# Patient Record
Sex: Male | Born: 1965 | Race: White | Hispanic: No | Marital: Married | Smoking: Never smoker
Health system: Southern US, Community
[De-identification: ages and names within clinical notes are randomized; demographics above are authoritative.]

## PROBLEM LIST (undated history)

## (undated) DIAGNOSIS — E039 Hypothyroidism, unspecified: Secondary | ICD-10-CM

## (undated) DIAGNOSIS — G43909 Migraine, unspecified, not intractable, without status migrainosus: Secondary | ICD-10-CM

## (undated) HISTORY — PX: BRAIN SURGERY: SHX531

## (undated) HISTORY — PX: APPENDECTOMY: SHX54

---

## 2004-11-05 ENCOUNTER — Ambulatory Visit: Payer: Self-pay | Admitting: Urology

## 2005-07-18 ENCOUNTER — Other Ambulatory Visit: Payer: Self-pay

## 2005-07-18 ENCOUNTER — Emergency Department: Payer: Self-pay | Admitting: Emergency Medicine

## 2005-08-26 ENCOUNTER — Ambulatory Visit: Payer: Self-pay | Admitting: Internal Medicine

## 2005-09-08 ENCOUNTER — Ambulatory Visit: Payer: Self-pay | Admitting: Internal Medicine

## 2005-10-28 ENCOUNTER — Ambulatory Visit: Payer: Self-pay | Admitting: Internal Medicine

## 2005-12-31 ENCOUNTER — Ambulatory Visit: Payer: Self-pay | Admitting: Internal Medicine

## 2006-01-15 ENCOUNTER — Ambulatory Visit: Payer: Self-pay | Admitting: Internal Medicine

## 2006-12-24 ENCOUNTER — Ambulatory Visit: Payer: Self-pay | Admitting: Internal Medicine

## 2008-06-21 ENCOUNTER — Ambulatory Visit: Payer: Self-pay | Admitting: Otolaryngology

## 2008-07-04 ENCOUNTER — Ambulatory Visit: Payer: Self-pay | Admitting: Otolaryngology

## 2008-08-01 ENCOUNTER — Ambulatory Visit: Payer: Self-pay | Admitting: Unknown Physician Specialty

## 2008-08-03 ENCOUNTER — Ambulatory Visit: Payer: Self-pay | Admitting: Unknown Physician Specialty

## 2011-09-18 ENCOUNTER — Ambulatory Visit: Payer: Self-pay

## 2013-07-21 ENCOUNTER — Ambulatory Visit: Payer: Self-pay | Admitting: Emergency Medicine

## 2014-08-10 ENCOUNTER — Ambulatory Visit: Payer: Self-pay

## 2014-12-27 ENCOUNTER — Ambulatory Visit
Admit: 2014-12-27 | Disposition: A | Discharge status: Home / Self Care | Payer: Self-pay | Attending: Family Medicine | Admitting: Family Medicine

## 2015-01-28 ENCOUNTER — Ambulatory Visit
Admission: EM | Admit: 2015-01-28 | Discharge: 2015-01-28 | Disposition: A | Discharge status: Home / Self Care | Payer: BLUE CROSS/BLUE SHIELD | Attending: Family Medicine | Admitting: Family Medicine

## 2015-01-28 ENCOUNTER — Encounter: Payer: Self-pay | Admitting: Emergency Medicine

## 2015-01-28 DIAGNOSIS — J302 Other seasonal allergic rhinitis: Secondary | ICD-10-CM | POA: Diagnosis not present

## 2015-01-28 DIAGNOSIS — H6993 Unspecified Eustachian tube disorder, bilateral: Secondary | ICD-10-CM

## 2015-01-28 DIAGNOSIS — H6983 Other specified disorders of Eustachian tube, bilateral: Secondary | ICD-10-CM

## 2015-01-28 HISTORY — DX: Migraine, unspecified, not intractable, without status migrainosus: G43.909

## 2015-01-28 HISTORY — DX: Hypothyroidism, unspecified: E03.9

## 2015-01-28 MED ORDER — BENZONATATE 100 MG PO CAPS: 100.0000 mg | ORAL_CAPSULE | Freq: Three times a day (TID) | ORAL | Status: DC | PRN

## 2015-01-28 MED ORDER — FLUTICASONE PROPIONATE 50 MCG/ACT NA SUSP: 2.0000 | Freq: Every day | NASAL | Status: DC

## 2015-01-28 NOTE — Discharge Instructions (Signed)
Take Zyrtec /ceterizine 1 tablet twice daily for 3-5 days then reduce to one tablet daily---RX Flonase/fluticasone nasal spray 1 spray per nostril twice daily for 3-5 days then reduce to one a day each nostril. Add Guafenisin /Robitussin DM 1 tsp every 4 hours as needed to liquefy mucous and stop cough .RX Benzonatate yellow capsule for cough 1 three times a day as needed. Push fluids- void frequently - steamy shower.Barotitis Media Barotitis media is inflammation of your middle ear. This occurs when the auditory tube (eustachian tube) leading from the back of your nose (nasopharynx) to your eardrum is blocked. This blockage may result from a cold, environmental allergies, or an upper respiratory infection. Unresolved barotitis media may lead to damage or hearing loss (barotrauma), which may become permanent. HOME CARE INSTRUCTIONS   Use medicines as recommended by your health care provider. Over-the-counter medicines will help unblock the canal and can help during times of air travel.  Do not put anything into your ears to clean or unplug them. Eardrops will not be helpful.  Do not swim, dive, or fly until your health care provider says it is all right to do so. If these activities are necessary, chewing gum with frequent, forceful swallowing may help. It is also helpful to hold your nose and gently blow to pop your ears for equalizing pressure changes. This forces air into the eustachian tube.  Only take over-the-counter or prescription medicines for pain, discomfort, or fever as directed by your health care provider.  A decongestant may be helpful in decongesting the middle ear and make pressure equalization easier. SEEK MEDICAL CARE IF:  You experience a serious form of dizziness in which you feel as if the room is spinning and you feel nauseated (vertigo).  Your symptoms only involve one ear. SEEK IMMEDIATE MEDICAL CARE IF:   You develop a severe headache, dizziness, or severe ear  pain.  You have bloody or pus-like drainage from your ears.  You develop a fever.  Your problems do not improve or become worse. MAKE SURE YOU:   Understand these instructions.  Will watch your condition.  Will get help right away if you are not doing well or get worse. Document Released: 08/21/2000 Document Revised: 06/14/2013 Document Reviewed: 03/21/2013 [facility] Patient Information 2015 [place], [place]. This information is not intended to replace advice given to you by your health care provider. Make sure you discuss any questions you have with your health care provider.  Allergic Rhinitis Allergic rhinitis is when the mucous membranes in the nose respond to allergens. Allergens are particles in the air that cause your body to have an allergic reaction. This causes you to release allergic antibodies. Through a chain of events, these eventually cause you to release histamine into the blood stream. Although meant to protect the body, it is this release of histamine that causes your discomfort, such as frequent sneezing, congestion, and an itchy, runny nose.  CAUSES  Seasonal allergic rhinitis (hay fever) is caused by pollen allergens that may come from grasses, trees, and weeds. Year-round allergic rhinitis (perennial allergic rhinitis) is caused by allergens such as house dust mites, pet dander, and mold spores.  SYMPTOMS   Nasal stuffiness (congestion).  Itchy, runny nose with sneezing and tearing of the eyes. DIAGNOSIS  Your health care provider can help you determine the allergen or allergens that trigger your symptoms. If you and your health care provider are unable to determine the allergen, skin or blood testing may be used. TREATMENT  Allergic rhinitis does not have a cure, but it can be controlled by:  Medicines and allergy shots (immunotherapy).  Avoiding the allergen. Hay fever may often be treated with antihistamines in pill or nasal spray forms. Antihistamines block  the effects of histamine. There are over-the-counter medicines that may help with nasal congestion and swelling around the eyes. Check with your health care provider before taking or giving this medicine.  If avoiding the allergen or the medicine prescribed do not work, there are many new medicines your health care provider can prescribe. Stronger medicine may be used if initial measures are ineffective. Desensitizing injections can be used if medicine and avoidance does not work. Desensitization is when a patient is given ongoing shots until the body becomes less sensitive to the allergen. Make sure you follow up with your health care provider if problems continue. HOME CARE INSTRUCTIONS It is not possible to completely avoid allergens, but you can reduce your symptoms by taking steps to limit your exposure to them. It helps to know exactly what you are allergic to so that you can avoid your specific triggers. SEEK MEDICAL CARE IF:   You have a fever.  You develop a cough that does not stop easily (persistent).  You have shortness of breath.  You start wheezing.  Symptoms interfere with normal daily activities. Document Released: 05/19/2001 Document Revised: 08/29/2013 Document Reviewed: 05/01/2013 [facility] Patient Information 2015 [place]. This information is not intended to replace advice given to you by your health care provider. Make sure you discuss any questions you have with your health care provider.

## 2015-01-28 NOTE — ED Provider Notes (Signed)
CSN: [id number]     Arrival date & time 01/28/15  6962 History   First MD Initiated Contact with Patient 01/28/15 [phone]     Chief Complaint  Patient presents with  . Sore Throat  . Sinusitis  . Otalgia    left ear   (Consider location/radiation/quality/duration/timing/severity/associated sxs/prior Treatment) HPI  49 yo M perturbed about a sore throat, stuffy ears and itchy eyes. Called PCP for antibiotic last week and was given Zpak -not examined. He is not happy that despite "those 5 little pills" being gone that he still has his symptoms. Afebrile.  Ears feel full-like going up the mountain, occassional popping noise  Past Medical History  Diagnosis Date  . Migraine   . Hypothyroidism    Past Surgical History  Procedure Laterality Date  . Appendectomy    . Brain surgery     History reviewed. No pertinent family history. History  Substance Use Topics  . Smoking status: Never Smoker   . Smokeless tobacco: Never Used  . Alcohol Use: No    Review of Systems  Constitutional -afebrile Eyes-denies visual changes, eyes are itchy ENT- normal voice,scratchy sore throat CV-denies chest pain Resp-denies SOB GI- negative for nausea,vomiting, diarrhea GU- negative for dysuria MSK- negative for back pain, ambulatory Skin- denies acute changes Neuro- negative headache,focal weakness or numbness    Allergies  Review of patient's allergies indicates no known allergies.  Home Medications   Prior to Admission medications   Medication Sig Start Date End Date Taking? Authorizing Provider  levothyroxine (SYNTHROID, LEVOTHROID) 175 MCG tablet Take 175 mcg by mouth daily before breakfast.   Yes Historical Provider, MD  zolmitriptan (ZOMIG) 5 MG tablet Take 5 mg by mouth as needed for migraine.   Yes Historical Provider, MD  benzonatate (TESSALON) 100 MG capsule Take 1 capsule (100 mg total) by mouth 3 (three) times daily as needed for cough. 01/28/15   Rae Halsted, PA-C  fluticasone  [facility]) 50 MCG/ACT nasal spray Place 2 sprays into both nostrils daily. 01/28/15   Rae Halsted, PA-C   BP 119/62 mmHg  Pulse 81  Temp(Src) 96.4 F (35.8 C) (Tympanic)  Resp 16  Ht  (1.803 m)  Wt 221 lb (100.245 kg)  BMI 30.84 kg/m2  SpO2 96% Physical Exam  Constitutional -alert and oriented,well appearing and in mild distress Head-atraumatic Eyes- conjunctiva normal, EOMI ,conjugate gaze- eyes are red rimmed and skin  Irritated Ears - TM clear bilaterally , mild retraction, no erythema Nose- Has mild congestion and rhinorrhea Mouth/throat- mucous membranes moist ,oropharynx non-erythematous Neck- supple without glandular enlargement CV- regular rate, grossly normal heart sounds, good peripheral circulation Resp-no distress, normal respiratory effort,clear to auscultation bilaterally GI- soft,non-tender,no distention GU- unremarkable/ not examined MSK- no lower extremity tenderness nor edema,no joint effusion, ambulatory Neuro- normal speech and language, no gross focal neurological deficit appreciated, no gait instability Skin-warm,dry ,intact; no rash noted Psych-mood and affect grossly normal; speech and behavior grossly normal  ED Course  Procedures (including critical care time) Labs Review Labs Reviewed - No data to display  Imaging Review No results found.   MDM   1. Eustachian tube dysfunction, bilateral   2. Seasonal allergies     Discussed viral syndrome and lack of response to antibiotic Rx.Also reviewed that atb has a prolonged half life and is in system if there is exposure to susceptible bacteria in the interim. Discussed seasonal allergies and eustachian tube dysfunction which seems to be most compatible with his presentation  and his story.  Plan: 1.  Diagnosis reviewed with patient-seasonal allergies suspected 2. Rx Benzonatate, fluticasone ; risks, benefits, potential side effects reviewed with patient 3. Recommend supportive treatment with OTC  ceterizine/guafenesin/tylenol/ibuprofen/fluids 4. F/u prn if symptoms worsen or don't improve; viral syndrome can be similar  Patient expresses understanding and interacts with questions and seems to want to understand.       Rae HalstedLaurie W Zakaria Sedor, PA-C 01/28/15 1600

## 2015-01-28 NOTE — ED Notes (Signed)
Patient c/o sore throat, sinus pain and pressure, HAs, and left ear pain for a week.  Patient states that he started a Z-Pack on Tuesday and has finished it. Patient denies recent fevers.

## 2019-06-09 ENCOUNTER — Other Ambulatory Visit: Payer: Self-pay | Admitting: *Deleted

## 2019-06-09 DIAGNOSIS — Z20822 Contact with and (suspected) exposure to covid-19: Secondary | ICD-10-CM

## 2019-06-10 LAB — NOVEL CORONAVIRUS, NAA: SARS-CoV-2, NAA: NOT DETECTED

## 2021-06-20 ENCOUNTER — Other Ambulatory Visit: Payer: Self-pay

## 2021-06-20 ENCOUNTER — Emergency Department
Admission: EM | Admit: 2021-06-20 | Discharge: 2021-06-21 | Disposition: A | Discharge status: Home / Self Care | Payer: BLUE CROSS/BLUE SHIELD | Attending: Emergency Medicine | Admitting: Emergency Medicine

## 2021-06-20 ENCOUNTER — Emergency Department: Payer: BLUE CROSS/BLUE SHIELD

## 2021-06-20 ENCOUNTER — Encounter: Payer: Self-pay | Admitting: Emergency Medicine

## 2021-06-20 DIAGNOSIS — S0990XA Unspecified injury of head, initial encounter: Secondary | ICD-10-CM | POA: Diagnosis present

## 2021-06-20 DIAGNOSIS — S0101XA Laceration without foreign body of scalp, initial encounter: Secondary | ICD-10-CM | POA: Diagnosis not present

## 2021-06-20 DIAGNOSIS — R1012 Left upper quadrant pain: Secondary | ICD-10-CM | POA: Diagnosis not present

## 2021-06-20 DIAGNOSIS — S20212A Contusion of left front wall of thorax, initial encounter: Secondary | ICD-10-CM | POA: Diagnosis not present

## 2021-06-20 DIAGNOSIS — S52125A Nondisplaced fracture of head of left radius, initial encounter for closed fracture: Secondary | ICD-10-CM

## 2021-06-20 DIAGNOSIS — S3991XA Unspecified injury of abdomen, initial encounter: Secondary | ICD-10-CM

## 2021-06-20 DIAGNOSIS — E039 Hypothyroidism, unspecified: Secondary | ICD-10-CM | POA: Diagnosis not present

## 2021-06-20 DIAGNOSIS — W11XXXA Fall on and from ladder, initial encounter: Secondary | ICD-10-CM | POA: Insufficient documentation

## 2021-06-20 LAB — CBC WITH DIFFERENTIAL/PLATELET
Abs Immature Granulocytes: 0.04 10*3/uL (ref 0.00–0.07)
Basophils Absolute: 0.1 10*3/uL (ref 0.0–0.1)
Basophils Relative: 1 %
Eosinophils Absolute: 0.1 10*3/uL (ref 0.0–0.5)
Eosinophils Relative: 1 %
HCT: 39.8 % (ref 39.0–52.0)
Hemoglobin: 13.2 g/dL (ref 13.0–17.0)
Immature Granulocytes: 1 %
Lymphocytes Relative: 13 %
Lymphs Abs: 1.2 10*3/uL (ref 0.7–4.0)
MCH: 30.2 pg (ref 26.0–34.0)
MCHC: 33.2 g/dL (ref 30.0–36.0)
MCV: 91.1 fL (ref 80.0–100.0)
Monocytes Absolute: 0.9 10*3/uL (ref 0.1–1.0)
Monocytes Relative: 10 %
Neutro Abs: 6.6 10*3/uL (ref 1.7–7.7)
Neutrophils Relative %: 74 %
Platelets: 288 10*3/uL (ref 150–400)
RBC: 4.37 MIL/uL (ref 4.22–5.81)
RDW: 13 % (ref 11.5–15.5)
WBC: 8.9 10*3/uL (ref 4.0–10.5)
nRBC: 0 % (ref 0.0–0.2)

## 2021-06-20 LAB — BASIC METABOLIC PANEL
Anion gap: 8 (ref 5–15)
BUN: 27 mg/dL — ABNORMAL HIGH (ref 6–20)
CO2: 27 mmol/L (ref 22–32)
Calcium: 9.2 mg/dL (ref 8.9–10.3)
Chloride: 102 mmol/L (ref 98–111)
Creatinine, Ser: 0.99 mg/dL (ref 0.61–1.24)
GFR, Estimated: >60 mL/min (ref >60)
Glucose, Bld: 111 mg/dL — ABNORMAL HIGH (ref 70–99)
Potassium: 3.9 mmol/L (ref 3.5–5.1)
Sodium: 137 mmol/L (ref 135–145)

## 2021-06-20 LAB — TROPONIN I (HIGH SENSITIVITY): Troponin I (High Sensitivity): 3 ng/L (ref <18)

## 2021-06-20 MED ORDER — SODIUM CHLORIDE 0.9 % IV BOLUS
1000.0000 mL | Freq: Once | INTRAVENOUS | Status: AC
  Administered 2021-06-20: 1000 mL via INTRAVENOUS

## 2021-06-20 MED ORDER — LIDOCAINE-EPINEPHRINE 2 %-1:100000 IJ SOLN
20.0000 mL | Freq: Once | INTRAMUSCULAR | Status: AC
  Administered 2021-06-20: 20 mL

## 2021-06-20 MED ORDER — MORPHINE SULFATE (PF) 4 MG/ML IV SOLN
4.0000 mg | Freq: Once | INTRAVENOUS | Status: AC
Start: 2021-06-20 — End: 2021-06-20
  Administered 2021-06-20: 4 mg via INTRAVENOUS
  Filled 2021-06-20: qty 1

## 2021-06-20 MED ORDER — OXYCODONE-ACETAMINOPHEN 5-325 MG PO TABS
1.0000 | ORAL_TABLET | Freq: Once | ORAL | Status: AC
  Administered 2021-06-20: 1 via ORAL
  Filled 2021-06-20: qty 1

## 2021-06-20 MED ORDER — IOHEXOL 350 MG/ML SOLN
100.0000 mL | Freq: Once | INTRAVENOUS | Status: AC | PRN
  Administered 2021-06-20: 100 mL via INTRAVENOUS

## 2021-06-20 MED ORDER — ONDANSETRON 4 MG PO TBDP
8.0000 mg | ORAL_TABLET | Freq: Once | ORAL | Status: AC
  Administered 2021-06-20: 8 mg via ORAL
  Filled 2021-06-20: qty 2

## 2021-06-20 NOTE — ED Provider Notes (Addendum)
[facility] Emergency Department Provider Note   ____________________________________________   Event Date/Time   First MD Initiated Contact with Patient 06/20/21 2047     (approximate)  I have reviewed the triage vital signs and the nursing notes.   HISTORY  Chief Complaint Fall    HPI Brian Rosario is a 55 y.o. male with past medical history of migraines and hypothyroidism who presents to the ED complaining of fall.  Patient's wife reports that he was up on a 4 foot ladder when he lost his balance and fell.  Patient is not sure whether he tripped and fell or became lightheaded and dizzy after the fall.  His upper abdomen and left chest landed on the back of a porch swing, he then fell forward and struck his head.  He is not sure whether he lost consciousness, does complain of significant headache.  He additionally complains of pain in his left upper quadrant and left lateral chest wall.  Pain is worse when he takes a deep breath, he denies any difficulty breathing.  He complains of pain in his left shoulder and left elbow, denies any pain in his right arm or bilateral lower extremities.  He does not take any blood thinners.  Patient became very lightheaded in triage and on recheck of vitals was found to have low BP, immediately brought back to a room.        Past Medical History:  Diagnosis Date   Hypothyroidism    Migraine     There are no problems to display for this patient.   Past Surgical History:  Procedure Laterality Date   APPENDECTOMY     BRAIN SURGERY      Prior to Admission medications   Medication Sig Start Date End Date Taking? Authorizing Provider  lidocaine (LIDODERM) 5 % Place 1 patch onto the skin every 12 (twelve) hours. Remove & Discard patch within 12 hours or as directed by MD 06/21/21 06/21/22 Yes Chesley Noon, MD  oxyCODONE-acetaminophen (PERCOCET) 5-325 MG tablet Take 1 tablet by mouth every 4 (four) hours as  needed for severe pain. 06/21/21 06/21/22 Yes Chesley Noon, MD  benzonatate (TESSALON) 100 MG capsule Take 1 capsule (100 mg total) by mouth 3 (three) times daily as needed for cough. 01/28/15   Rae Halsted, PA-C  fluticasone [facility]) 50 MCG/ACT nasal spray Place 2 sprays into both nostrils daily. 01/28/15   Rae Halsted, PA-C  levothyroxine (SYNTHROID, LEVOTHROID) 175 MCG tablet Take 175 mcg by mouth daily before breakfast.    [provider]  zolmitriptan (ZOMIG) 5 MG tablet Take 5 mg by mouth as needed for migraine.    [provider]    Allergies Patient has no known allergies.  No family history on file.  Social History Social History   Tobacco Use   Smoking status: Never   Smokeless tobacco: Never  Substance Use Topics   Alcohol use: No   Drug use: No    Review of Systems  Constitutional: No fever/chills Eyes: No visual changes. ENT: No sore throat. Cardiovascular: Positive for chest wall pain. Respiratory: Denies shortness of breath. Gastrointestinal: Positive for abdominal pain.  No nausea, no vomiting.  No diarrhea.  No constipation. Genitourinary: Negative for dysuria. Musculoskeletal: Negative for back pain.  Positive for left arm pain. Skin: Negative for rash. Neurological: Negative for headaches, focal weakness or numbness.  ____________________________________________   PHYSICAL EXAM:  VITAL SIGNS: ED Triage Vitals  Enc Vitals Group  BP 06/20/21 1838 (!) 142/79     Pulse Rate 06/20/21 1838 90     Resp 06/20/21 1838 18     Temp 06/20/21 1838 98.4 F (36.9 C)     Temp Source 06/20/21 1838 Oral     SpO2 06/20/21 1838 97 %     Weight 06/20/21 1850 235 lb (106.6 kg)     Height 06/20/21 1850 5\' 11"  (1.803 m)     Head Circumference --      Peak Flow --      Pain Score 06/20/21 1849 6     Pain Loc --      Pain Edu? --      Excl. in GC? --     Constitutional: Alert and oriented. Eyes: Conjunctivae are normal. Head:  Approximately 8 cm laceration to left frontal scalp, no active bleeding. Nose: No congestion/rhinnorhea. Mouth/Throat: Mucous membranes are moist. Neck: Normal ROM Cardiovascular: Normal rate, regular rhythm. Grossly normal heart sounds.  2+ radial pulses bilaterally. Respiratory: Normal respiratory effort.  No retractions. Lungs CTAB.  Left chest wall tenderness to palpation with overlying ecchymosis. Gastrointestinal: Soft and tender to palpation in the left upper quadrant with no rebound or guarding. No distention. Genitourinary: deferred Musculoskeletal: No lower extremity tenderness nor edema.  Diffuse tenderness to palpation of left shoulder and left elbow with no obvious deformity. Neurologic:  Normal speech and language. No gross focal neurologic deficits are appreciated. Skin:  Skin is warm, dry and intact. No rash noted. Psychiatric: Mood and affect are normal. Speech and behavior are normal.  ____________________________________________   LABS (all labs ordered are listed, but only abnormal results are displayed)  Labs Reviewed  BASIC METABOLIC PANEL - Abnormal; Notable for the following components:      Result Value   Glucose, Bld 111 (*)    BUN 27 (*)    All other components within normal limits  CBC WITH DIFFERENTIAL/PLATELET  TROPONIN I (HIGH SENSITIVITY)   ____________________________________________  EKG  ED ECG REPORT I, 06/22/21, the attending physician, personally viewed and interpreted this ECG.   Date: 06/20/2021  EKG Time: 20:50  Rate: 78  Rhythm: normal sinus rhythm  Axis: LAD  Intervals:none  ST&T Change: None   PROCEDURES  Procedure(s) performed (including Critical Care):  10/16/2022Marland KitchenLaceration Repair  Date/Time: 06/21/2021 12:20 AM Performed by: 06/23/2021, MD Authorized by: Chesley Noon, MD   Consent:    Consent obtained:  Verbal   Consent given by:  Patient   Risks, benefits, and alternatives were discussed: yes     Risks  discussed:  Infection, pain, retained foreign body, tendon damage, vascular damage, poor wound healing, need for additional repair, poor cosmetic result and nerve damage Universal protocol:    Patient identity confirmed:  Verbally with patient and arm band Anesthesia:    Anesthesia method:  Local infiltration   Local anesthetic:  Lidocaine 2% WITH epi Laceration details:    Location:  Scalp   Scalp location:  Frontal   Length (cm):  6 Pre-procedure details:    Preparation:  Patient was prepped and draped in usual sterile fashion and imaging obtained to evaluate for foreign bodies Exploration:    Limited defect created (wound extended): no     Hemostasis achieved with:  Epinephrine and direct pressure   Imaging obtained comment:  CT   Wound exploration: wound explored through full range of motion and entire depth of wound visualized     Wound extent: no areolar tissue violation noted, no foreign  bodies/material noted, no muscle damage noted, no nerve damage noted, no tendon damage noted, no underlying fracture noted and no vascular damage noted     Contaminated: no   Treatment:    Area cleansed with:  Saline   Amount of cleaning:  Standard   Irrigation solution:  Sterile saline   Irrigation method:  Pressure wash   Visualized foreign bodies/material removed: no     Debridement:  None   Undermining:  None   Scar revision: no   Skin repair:    Repair method:  Sutures   Suture size:  4-0   Suture material:  Nylon   Suture technique:  Simple interrupted   Number of sutures:  6 Approximation:    Approximation:  Close Repair type:    Repair type:  Simple Post-procedure details:    Dressing:  Open (no dressing)   Procedure completion:  Tolerated well, no immediate complications   ____________________________________________   INITIAL IMPRESSION / ASSESSMENT AND PLAN / ED COURSE      55 year old male with past medical history of migraines and hypothyroidism who presents to  the ED following fall off a 4 foot ladder, striking his abdomen, left chest wall, and forehead.  CT head and cervical spine were performed from triage and are negative for acute process, patient does have significant scalp laceration that will require repair.  He then became very lightheaded and nearly lost consciousness in the waiting room, subsequently found to have low BP and immediately brought back to her room.  BP is gradually improving with IV fluid administration and I would consider vasovagal episode but we will further assess with CT of his chest and abdomen/pelvis.  We will also check x-ray of his left elbow, x-rays of left shoulder reviewed by me and show no fracture or dislocation.  We will treat symptomatically with IV morphine and reassess.  CT of abdomen and pelvis is negative for acute process, no evidence of rib fracture but patient does have significant chest wall contusion.  X-rays of left elbow reveal nondisplaced radial head fracture, we will place patient in sling and have him follow-up with orthopedics.  He was prescribed short course of pain medication and Lidoderm patches for chest wall contusion.  He was counseled to follow-up for suture removal in 1 week, otherwise counseled to return to the ED for new worsening symptoms.  Patient agrees with plan.      ____________________________________________   FINAL CLINICAL IMPRESSION(S) / ED DIAGNOSES  Final diagnoses:  Abdominal trauma  Injury of head, initial encounter  Laceration of scalp, initial encounter  Contusion of left chest wall, initial encounter     ED Discharge Orders          Ordered    oxyCODONE-acetaminophen (PERCOCET) 5-325 MG tablet  Every 4 hours PRN        06/21/21 0012    lidocaine (LIDODERM) 5 %  Every 12 hours        06/21/21 0012             Note:  This document was prepared using Dragon voice recognition software and may include unintentional dictation errors.    Chesley Noon,  MD 06/21/21 7616    Chesley Noon, MD 06/21/21 [phone]

## 2021-06-20 NOTE — ED Triage Notes (Signed)
Patient to ED via POV for a fall of a ladder approx 4 feet off the ground. Deep laceration noted to the left side of head-bleeding controlled at this time. Denies LOC and blood thinners, Left shoulder and rib cage pain noted. Patient Aox4 at this time.

## 2021-06-20 NOTE — ED Provider Notes (Signed)
Emergency Medicine Provider Triage Evaluation Note  Brian Rosario , a 55 y.o. male  was evaluated in triage.  Pt complains of head injury, left rib, left shoulder and left arm pain.  Patient presents after falling off a ladder.  Patient was on a ladder, trying to replace a light bulb on the reports.  Patient fell off the ladder, landed against a swing on the porch.  Patient is complaining of hitting his head but not losing consciousness.  He has a deep laceration to the left forehead.  Patient is also complaining of severe left rib pain, left shoulder pain.  No subsequent loss of consciousness.  Patient denies any abdominal pain.  Last tetanus shot was a year ago.  Review of Systems  Positive: Fall off a ladder striking his head, forehead laceration, left rib and left shoulder and left arm pain Negative: Substernal chest pain, shortness of breath, abdominal pain, loss of consciousness  Physical Exam  BP 134/75 (BP Location: Right Arm)   Pulse 86   Temp 98.2 F (36.8 C)   Resp 17   SpO2 97%  Gen:   Awake, no distress   Resp:  Normal effort  MSK:   Limited range of motion to left upper extremity due to injury.  Tender over the [facility] joint, lateral shoulder extending into the humerus.  Patient is tender throughout the left ribs, no palpable abnormality or crepitus.  No subcutaneous emphysema.  Good underlying breath sounds bilaterally. Other:  Bowel sounds x4 quadrants.  No appreciable external trauma.  Patient denies any tenderness to palpation over the abdomen  Medical Decision Making  Medically screening exam initiated at 6:50 PM.  Appropriate orders placed.  Brian Rosario was informed that the remainder of the evaluation will be completed by another provider, this initial triage assessment does not replace that evaluation, and the importance of remaining in the ED until their evaluation is complete.  Patient presents the emergency department after falling off a ladder and  striking his head and left chest.  Patient has an open wound to the left head.  He will have imaging of the head, neck, ribs, shoulder and upper arm.  Up-to-date on tetanus immunization.  Pain medicine and nausea medicine given at this time.   Brian Rosario 06/20/21 1854    Brian Noon, MD 06/21/21 0030

## 2021-06-20 NOTE — ED Notes (Signed)
Pt became diaphoretic and clammy in triage. IV established and and fluids started.

## 2021-06-21 MED ORDER — LIDOCAINE 5 % EX PTCH: 1.0000 | MEDICATED_PATCH | Freq: Two times a day (BID) | CUTANEOUS | 0 refills | Status: AC

## 2021-06-21 MED ORDER — OXYCODONE-ACETAMINOPHEN 5-325 MG PO TABS: 1.0000 | ORAL_TABLET | ORAL | 0 refills | Status: AC | PRN

## 2023-04-27 IMAGING — DX DG ELBOW COMPLETE 3+V*L*
4 series · 4 of 4 positions shown · non-contrast
Comparison: None.

CLINICAL DATA: Status post fall.

EXAM:
LEFT ELBOW - COMPLETE 3+ VIEW

[elbow ap]
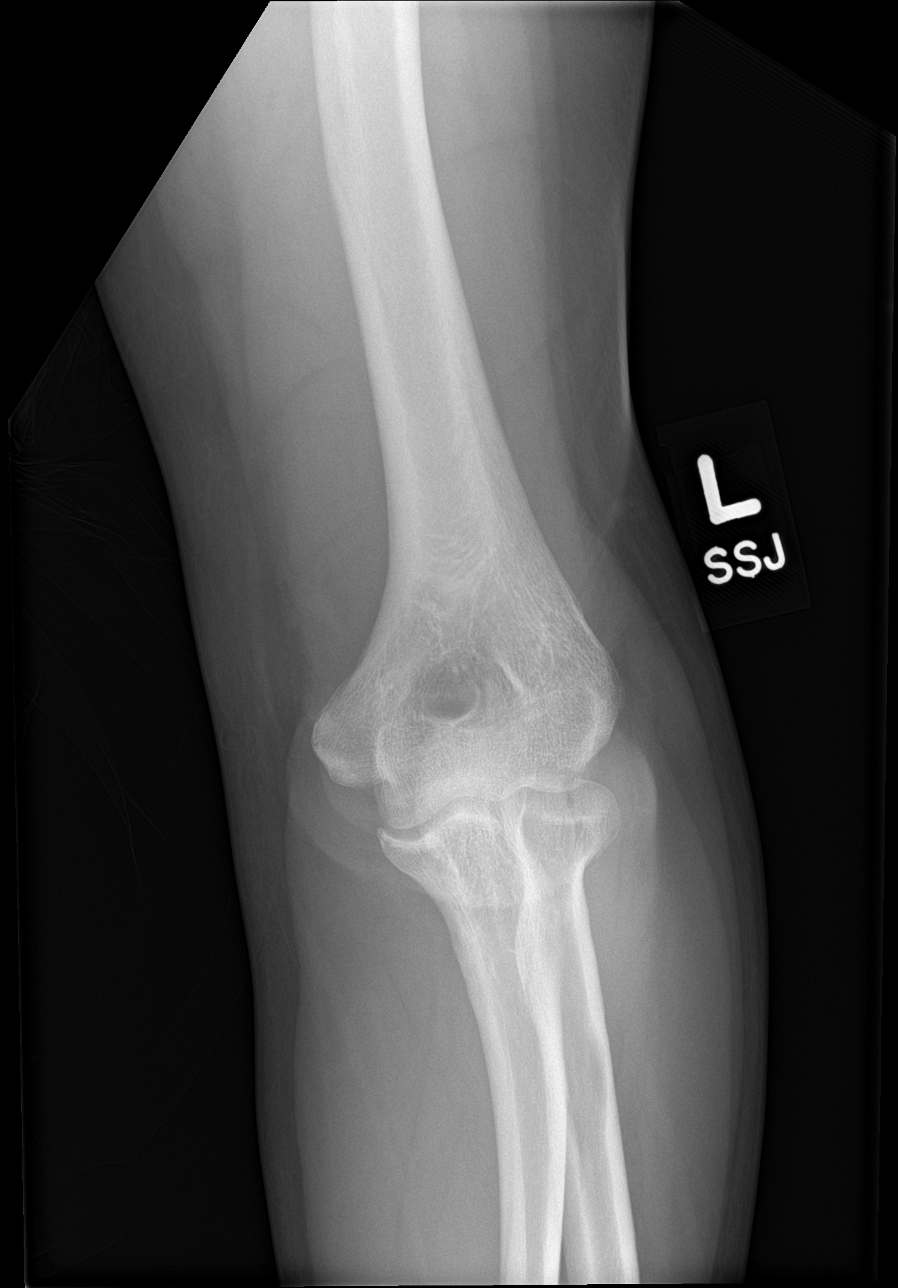

[elbow obl (1 of 2)]
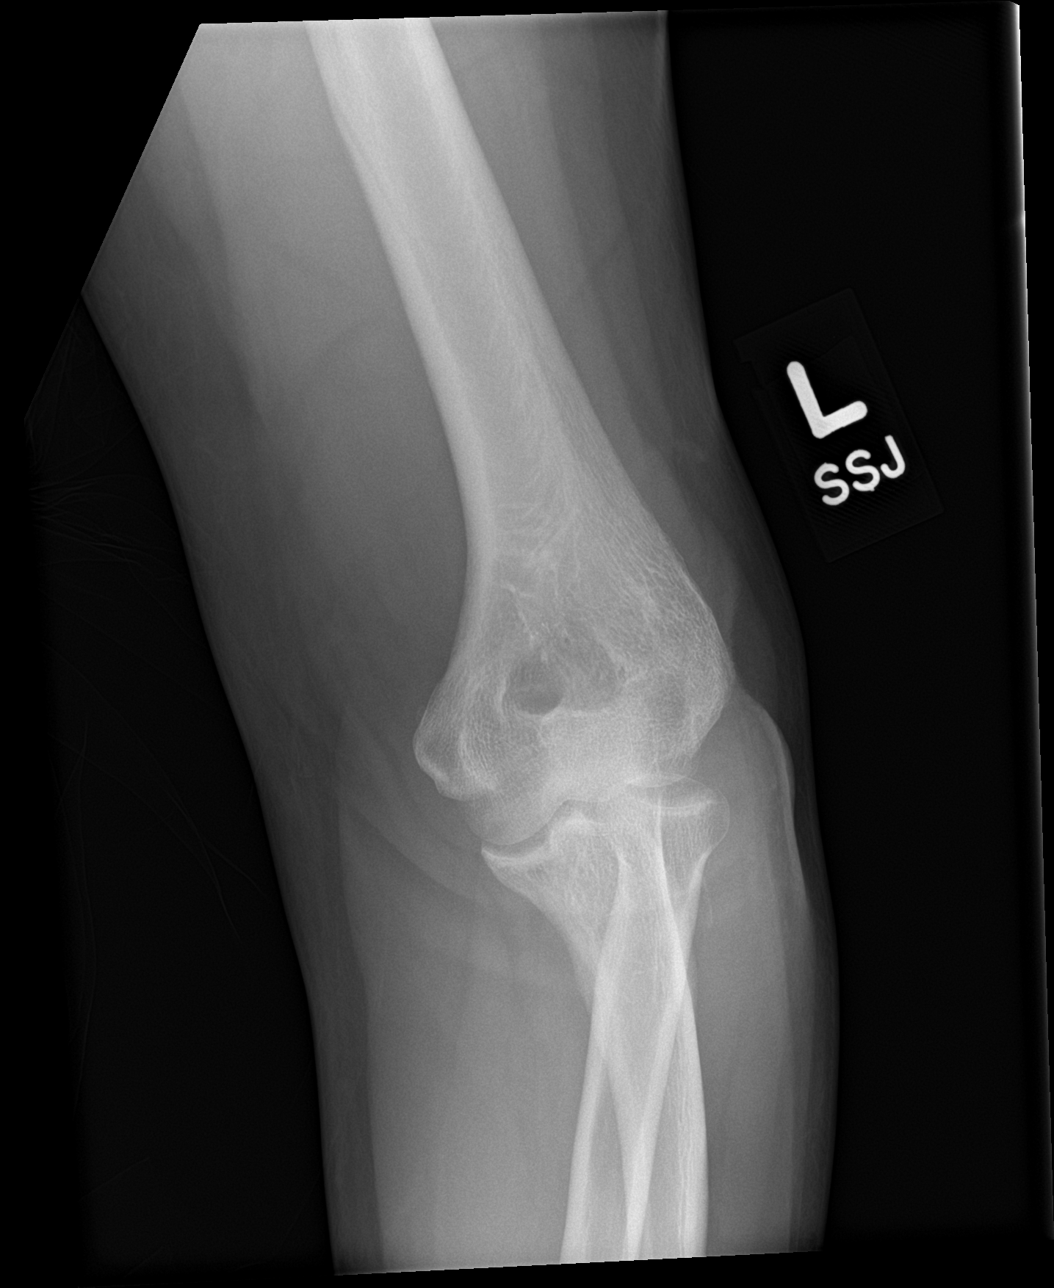

[elbow obl (2 of 2)]
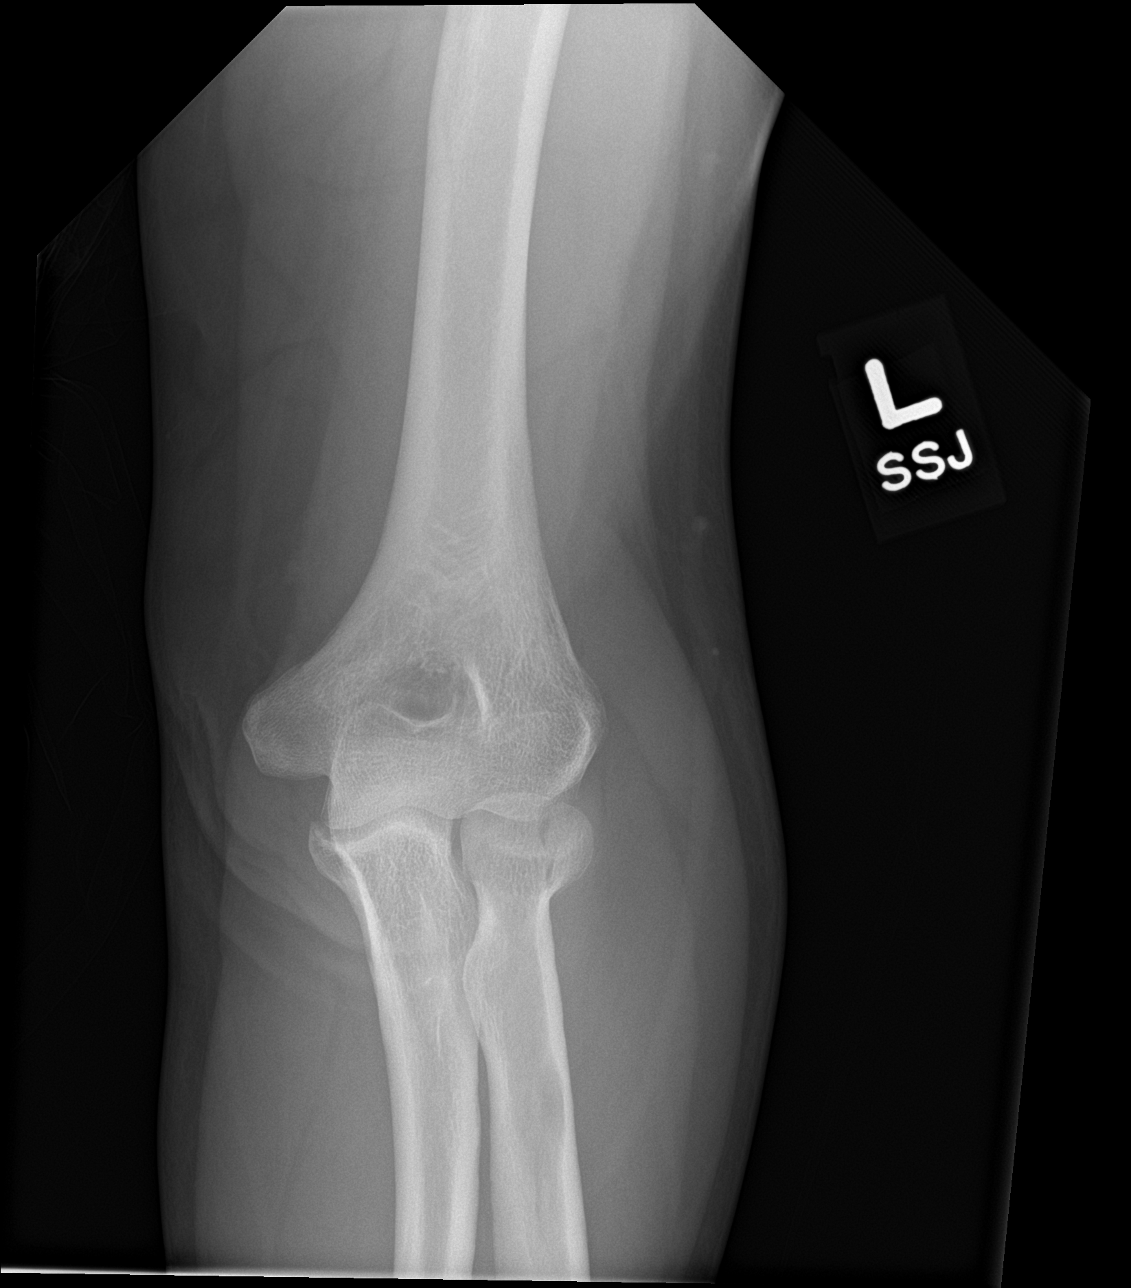

[elbow lat]
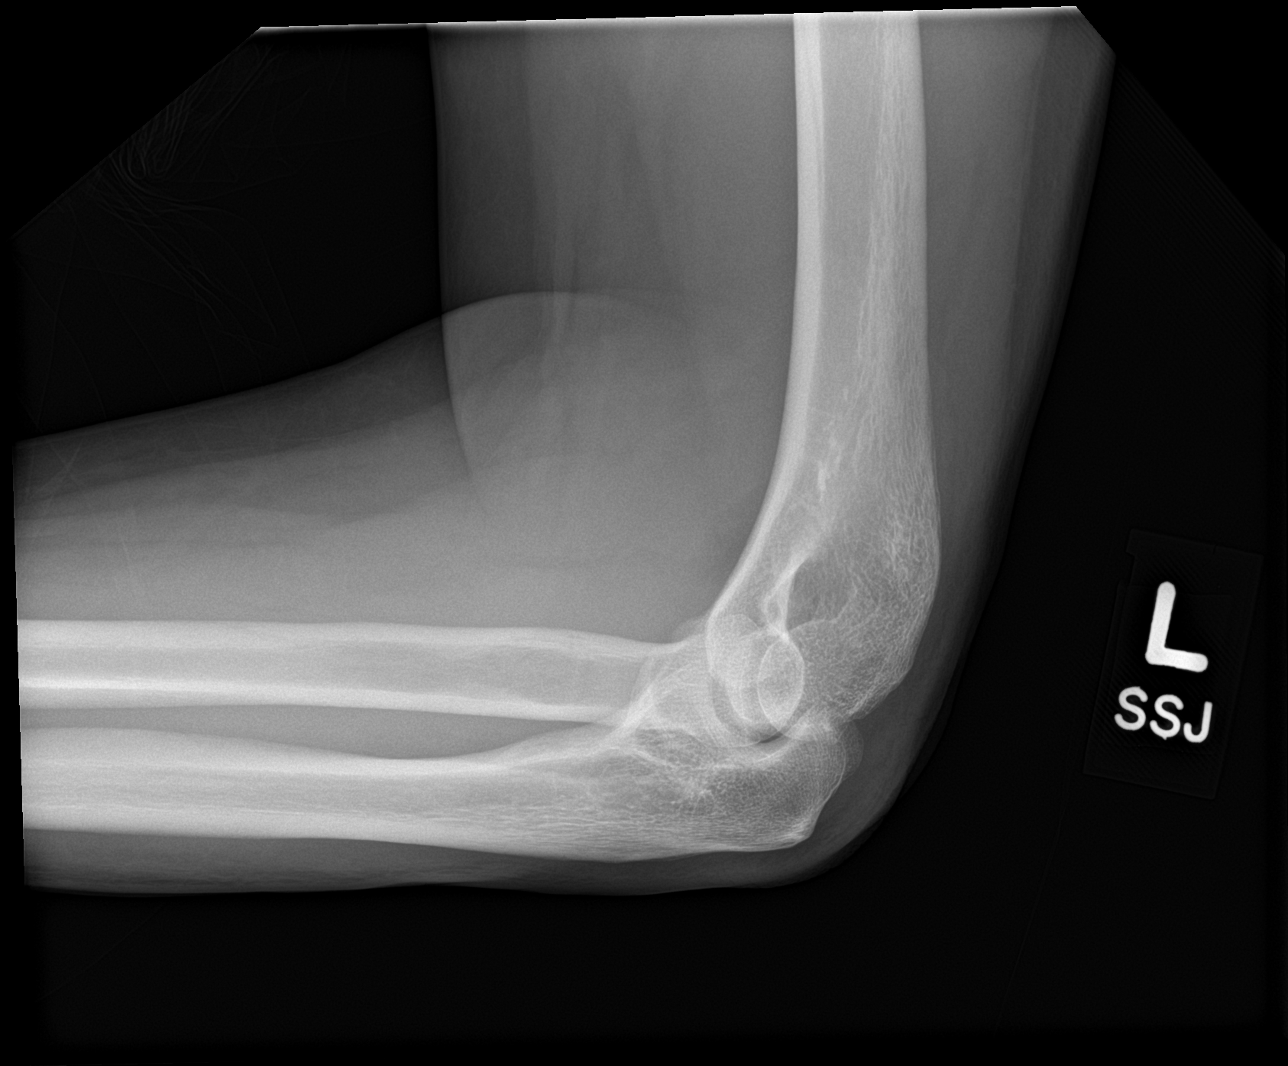

[4 of 4 positions shown; findings below may reference images not displayed]

FINDINGS: An acute, nondisplaced fracture is seen extending through the left
radial head. There is no evidence of dislocation. There is no
evidence of arthropathy or other focal bone abnormality. Soft
tissues are unremarkable.
IMPRESSION: Acute fracture of the left radial head.

## 2024-04-19 ENCOUNTER — Ambulatory Visit: Admitting: Urology

## 2024-05-09 ENCOUNTER — Encounter: Admitting: Urology

## 2024-05-16 ENCOUNTER — Ambulatory Visit (INDEPENDENT_AMBULATORY_CARE_PROVIDER_SITE_OTHER): Admitting: Urology

## 2024-05-16 VITALS — BP 129/82 | HR 70 | Ht 71.0 in | Wt 240.0 lb

## 2024-05-16 DIAGNOSIS — N138 Other obstructive and reflux uropathy: Secondary | ICD-10-CM

## 2024-05-16 DIAGNOSIS — N3281 Overactive bladder: Secondary | ICD-10-CM | POA: Diagnosis not present

## 2024-05-16 DIAGNOSIS — Z125 Encounter for screening for malignant neoplasm of prostate: Secondary | ICD-10-CM

## 2024-05-16 DIAGNOSIS — N401 Enlarged prostate with lower urinary tract symptoms: Secondary | ICD-10-CM

## 2024-05-16 DIAGNOSIS — R3589 Other polyuria: Secondary | ICD-10-CM

## 2024-05-16 DIAGNOSIS — R399 Unspecified symptoms and signs involving the genitourinary system: Secondary | ICD-10-CM | POA: Diagnosis not present

## 2024-05-16 LAB — BLADDER SCAN AMB NON-IMAGING

## 2024-05-16 MED ORDER — OXYBUTYNIN CHLORIDE ER 10 MG PO TB24: 10.0000 mg | ORAL_TABLET | Freq: Every day | ORAL | 11 refills | Status: DC

## 2024-05-16 NOTE — Progress Notes (Signed)
   05/16/24 3:28 PM   Brian Rosario Jun 24, 1966 [id number]  CC: PSA screening, Urinary symptoms  HPI: 58 year old male with bothersome urinary symptoms of frequency and nocturia.  Nocturia 3-4 times per night, frequency every 1 to 1-1/2 hours during the day as well as urgency.  He drinks primarily water but some caffeine during the day.  He reports a negative sleep study previously.  Had some improvement in strength and stream after Flomax started by PCP.  PVR today normal at 15ml,.  Normal creatinine, urinalysis benign, PSA normal at 3.1   PMH: Past Medical History:  Diagnosis Date   Hypothyroidism    Migraine     Surgical History: Past Surgical History:  Procedure Laterality Date   APPENDECTOMY     BRAIN SURGERY      Family History: No family history on file.  Social History:  reports that he has never smoked. He has never used smokeless tobacco. He reports that he does not drink alcohol and does not use drugs.  Physical Exam: BP 129/82 (BP Location: Left Arm, Patient Position: Sitting, Cuff Size: Large)   Pulse 70   Ht 5' 11 (1.803 m)   Wt 240 lb (108.9 kg)   SpO2 98%   BMI 33.47 kg/m    Constitutional:  Alert and oriented, No acute distress. Cardiovascular: No clubbing, cyanosis, or edema. Respiratory: Normal respiratory effort, no increased work of breathing. GI: Abdomen is soft, nontender, nondistended, no abdominal masses   Laboratory Data: Reviewed, see HPI  Pertinent Imaging: I have personally viewed and interpreted the CT chest abdomen pelvis from October 2022 showing no urologic abnormalities.  Assessment & Plan:   58 year old male with primarily overactive symptoms and nocturia despite Flomax.  We discussed options including behavioral strategies, trial of OAB medication, or cystoscopy/TRUS.  He opted for trial of oxybutynin  in addition to Flomax.  Reassurance provided regarding normal PSA, urinalysis, PVR.  Trial of oxybutynin  10 mg XL  daily, continue Flomax RTC 6 weeks PVR   Brian Burnet, MD 05/16/2024  [facility] [address], [address] [place], [place] [address] [phone]

## 2024-05-16 NOTE — Patient Instructions (Signed)

## 2024-05-18 ENCOUNTER — Encounter: Admitting: Urology

## 2024-06-27 ENCOUNTER — Ambulatory Visit: Admitting: Urology

## 2024-06-27 VITALS — BP 129/78 | HR 58

## 2024-06-27 DIAGNOSIS — N3281 Overactive bladder: Secondary | ICD-10-CM | POA: Diagnosis not present

## 2024-06-27 DIAGNOSIS — R399 Unspecified symptoms and signs involving the genitourinary system: Secondary | ICD-10-CM

## 2024-06-27 LAB — BLADDER SCAN AMB NON-IMAGING

## 2024-06-27 MED ORDER — OXYBUTYNIN CHLORIDE ER 15 MG PO TB24: 15.0000 mg | ORAL_TABLET | Freq: Every day | ORAL | 11 refills | Status: AC

## 2024-06-27 NOTE — Progress Notes (Signed)
   06/27/2024 4:14 PM   Brian Rosario November 25, 1965 [id number]  Reason for visit: Follow up urinary symptoms, PSA screening  History: Initial visit September 2025 for bothersome urinary symptoms of frequency and nocturia and urgency Had some improvement with Flomax from PCP, PVR normal, PSA normal at 3.1, urinalysis benign, renal function normal Oxybutynin  10 mg XL started September 2025  Physical Exam: BP 129/78 (BP Location: Left Arm, Patient Position: Sitting, Cuff Size: Large)   Pulse (!) 58   SpO2 96%    Imaging/labs: Reviewed  Today: He reports about 75% improvement on oxybutynin .  Continues on the oxybutynin  and Flomax.  PVR today normal again at 18ml.  IPSS score 21, quality-of-life unhappy  Plan:   Urinary symptoms: I offered cystoscopy for further evaluation but he deferred.  He would like to continue on the Flomax, and I increased the oxybutynin  to 15 mg XL daily.  Could consider cystoscopy again in the future if worsening symptoms or trial of a beta 3 agonist RTC 3 months symptom check, will continue PSA monitoring as well   Brian JAYSON Burnet, MD  [facility] [address], [address] [place], [place] [address] [phone]

## 2024-10-31 ENCOUNTER — Ambulatory Visit: Admitting: Urology
